# Patient Record
Sex: Female | Born: 1950 | Race: Black or African American | Hispanic: No
Health system: Southern US, Community
[De-identification: ages and names within clinical notes are randomized; demographics above are authoritative.]

## PROBLEM LIST (undated history)

## (undated) DIAGNOSIS — N289 Disorder of kidney and ureter, unspecified: Secondary | ICD-10-CM

## (undated) DIAGNOSIS — E7800 Pure hypercholesterolemia, unspecified: Secondary | ICD-10-CM

## (undated) DIAGNOSIS — E119 Type 2 diabetes mellitus without complications: Secondary | ICD-10-CM

## (undated) DIAGNOSIS — I10 Essential (primary) hypertension: Secondary | ICD-10-CM

## (undated) HISTORY — PX: LEG SURGERY: SHX1003

---

## 2018-01-18 ENCOUNTER — Emergency Department (HOSPITAL_BASED_OUTPATIENT_CLINIC_OR_DEPARTMENT_OTHER): Payer: Medicare Other

## 2018-01-18 ENCOUNTER — Encounter (HOSPITAL_BASED_OUTPATIENT_CLINIC_OR_DEPARTMENT_OTHER): Payer: Self-pay

## 2018-01-18 ENCOUNTER — Emergency Department (HOSPITAL_BASED_OUTPATIENT_CLINIC_OR_DEPARTMENT_OTHER)
Admission: EM | Admit: 2018-01-18 | Discharge: 2018-01-18 | Disposition: A | Discharge status: Home / Self Care | Payer: Medicare Other | Attending: Emergency Medicine | Admitting: Emergency Medicine

## 2018-01-18 ENCOUNTER — Other Ambulatory Visit: Payer: Self-pay

## 2018-01-18 DIAGNOSIS — W01198A Fall on same level from slipping, tripping and stumbling with subsequent striking against other object, initial encounter: Secondary | ICD-10-CM | POA: Diagnosis not present

## 2018-01-18 DIAGNOSIS — I10 Essential (primary) hypertension: Secondary | ICD-10-CM | POA: Diagnosis not present

## 2018-01-18 DIAGNOSIS — Y92008 Other place in unspecified non-institutional (private) residence as the place of occurrence of the external cause: Secondary | ICD-10-CM | POA: Insufficient documentation

## 2018-01-18 DIAGNOSIS — Y998 Other external cause status: Secondary | ICD-10-CM | POA: Diagnosis not present

## 2018-01-18 DIAGNOSIS — S8992XA Unspecified injury of left lower leg, initial encounter: Secondary | ICD-10-CM | POA: Diagnosis present

## 2018-01-18 DIAGNOSIS — S93601A Unspecified sprain of right foot, initial encounter: Secondary | ICD-10-CM | POA: Insufficient documentation

## 2018-01-18 DIAGNOSIS — E119 Type 2 diabetes mellitus without complications: Secondary | ICD-10-CM | POA: Insufficient documentation

## 2018-01-18 DIAGNOSIS — W19XXXA Unspecified fall, initial encounter: Secondary | ICD-10-CM

## 2018-01-18 DIAGNOSIS — S8392XA Sprain of unspecified site of left knee, initial encounter: Secondary | ICD-10-CM | POA: Insufficient documentation

## 2018-01-18 DIAGNOSIS — Y9389 Activity, other specified: Secondary | ICD-10-CM | POA: Diagnosis not present

## 2018-01-18 HISTORY — DX: Disorder of kidney and ureter, unspecified: N28.9

## 2018-01-18 HISTORY — DX: Pure hypercholesterolemia, unspecified: E78.00

## 2018-01-18 HISTORY — DX: Essential (primary) hypertension: I10

## 2018-01-18 HISTORY — DX: Type 2 diabetes mellitus without complications: E11.9

## 2018-01-18 MED ORDER — ACETAMINOPHEN 325 MG PO TABS
ORAL_TABLET | ORAL | Status: AC
  Filled 2018-01-18: qty 2

## 2018-01-18 MED ORDER — ACETAMINOPHEN 500 MG PO TABS: 1000.0000 mg | ORAL_TABLET | Freq: Four times a day (QID) | ORAL | 0 refills | Status: AC | PRN

## 2018-01-18 MED ORDER — ACETAMINOPHEN 325 MG PO TABS
650.0000 mg | ORAL_TABLET | Freq: Once | ORAL | Status: AC
Start: 2018-01-18 — End: 2018-01-18
  Administered 2018-01-18: 650 mg via ORAL

## 2018-01-18 MED ORDER — TRAMADOL HCL 50 MG PO TABS
100.0000 mg | ORAL_TABLET | Freq: Once | ORAL | Status: AC
  Administered 2018-01-18: 100 mg via ORAL
  Filled 2018-01-18: qty 2

## 2018-01-18 MED ORDER — TRAMADOL HCL 50 MG PO TABS: ORAL_TABLET | ORAL | 0 refills | Status: AC

## 2018-01-18 NOTE — ED Triage Notes (Signed)
Pt arrives via EMS after falling twice at home today; pt is dialysis pt; fistula in left arm. Pt reports right foot and left knee pain. States she tripped both times; denies LOC.

## 2018-01-18 NOTE — ED Provider Notes (Signed)
[facility] EMERGENCY DEPARTMENT Provider Note   CSN: [id number] Arrival date & time: 01/18/18  1945     History   Chief Complaint Chief Complaint  Patient presents with  . Fall    HPI Sandra Green is a 67 y.o. female.  HPI Patient fell twice today.  The first time she was stepping off of the porch.  Her daughter was with her.  She reports she just forgot that she needed to step down on the first step.  She landed on her knees.  She reports particularly on her left knee.  She reports her left knee has hurt since that time.  Reports that also her right foot hurts.  She reports is mostly the top of the foot and along the heel.  They are very painful with weightbearing.  She reports she did have a second fall today.  Her daughter was not with her.  She was in her bedroom.  Patient reports that she lost her balance.  She denies she sustained any kind of injury with this fall.  Reports that they were planning to come to the emergency department anyway for her knee and foot pain.  No head injury, no neck pain, no chest or abdominal injury. Past Medical History:  Diagnosis Date  . Diabetes mellitus without complication (HCC)   . Hypercholesteremia   . Hypertension   . Renal disorder     There are no active problems to display for this patient.   Past Surgical History:  Procedure Laterality Date  . LEG SURGERY       OB History   None      Home Medications    Prior to Admission medications   Medication Sig Start Date End Date Taking? Authorizing Provider  acetaminophen (TYLENOL) 500 MG tablet Take 2 tablets (1,000 mg total) by mouth every 6 (six) hours as needed. 01/18/18   Arby Barrette, MD  traMADol Janean Sark) 50 MG tablet 1-2 every 6 hours as needed 01/18/18   Arby Barrette, MD    Family History No family history on file.  Social History Social History   Tobacco Use  . Smoking status: Not on file  Substance Use Topics  . Alcohol use: Not on file  . Drug  use: Not on file     Allergies   Phenergan [promethazine hcl] and Vioxx [rofecoxib]   Review of Systems Review of Systems 10 Systems reviewed and are negative for acute change except as noted in the HPI.   Physical Exam Updated Vital Signs BP 117/83 (BP Location: Right Arm)   Pulse 72   Temp 99.6 F (37.6 C) (Oral)   Resp 20   Ht 5\' 3"  (1.6 m)   Wt 97.5 kg (215 lb)   SpO2 94%   BMI 38.09 kg/m   Physical Exam  Constitutional: She is oriented to person, place, and time.  Patient is alert and appropriate.  No respiratory distress.  Mental status clear.  HENT:  Head: Normocephalic and atraumatic.  Nose: Nose normal.  Eyes: EOM are normal.  Neck: Neck supple.  Cardiovascular: Normal rate, regular rhythm, normal heart sounds and intact distal pulses.  Pulmonary/Chest: Effort normal and breath sounds normal. She exhibits no tenderness.  Abdominal: She exhibits no distension. There is no tenderness. There is no guarding.  Musculoskeletal:  Normal visual inspection of bilateral lower extremities.  Patient does have very well-healed old scar on right knee from apparent knee replacement.  Left knee is knee that is painful.  Objectively, there is no swelling, no abrasion, no erythema.  No soft tissue anomalies appreciable.  She endorses discomfort to palpation over the patella and the joint lines.  Calf is soft and nontender.  No significant peripheral edema.  And also endorses significant tenderness to any palpation over the right foot.  objectively, no deformity, no soft tissue abrasions, swelling or erythema.  Neurological: She is alert and oriented to person, place, and time. No cranial nerve deficit. She exhibits normal muscle tone. Coordination normal.  Skin: Skin is warm and dry.  Psychiatric: She has a normal mood and affect.     ED Treatments / Results  Labs (all labs ordered are listed, but only abnormal results are displayed) Labs Reviewed - No data to  display  EKG None  Radiology Dg Knee Complete 4 Views Left  Result Date: 01/18/2018 CLINICAL DATA:  Patient fell at home today. Right foot and left knee pain. EXAM: LEFT KNEE - COMPLETE 4+ VIEW COMPARISON:  None. FINDINGS: There Is soft tissue swelling along the anteromedial aspect of the left knee. Tricompartmental osteoarthritic joint space narrowing is identified with spurring, more so involving the medial femorotibial compartment. 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 No acute fracture, joint effusion or malalignment. Diffuse atherosclerotic calcifications of the included femoral through tibial arteries. IMPRESSION: Tricompartmental osteoarthritis of the left knee without acute fracture nor joint effusion. Electronically Signed   By: Tollie Ethavid  Kwon M.D.   On: 01/18/2018 22:44   Dg Foot Complete Right  Result Date: 01/18/2018 CLINICAL DATA:  Patient fell at home today.  Right foot pain. EXAM: RIGHT FOOT COMPLETE - 3+ VIEW COMPARISON:  None. FINDINGS: Diffuse bone demineralization. Degenerative changes in the first metatarsal-phalangeal joint. No evidence of acute fracture or dislocation. No focal bone lesion or bone destruction. Soft tissues are unremarkable. Prominent vascular calcifications. IMPRESSION: No acute bony abnormalities. Degenerative changes of the first metatarsal-phalangeal joint. Vascular calcifications. Electronically Signed   By: Burman NievesWilliam  Stevens M.D.   On: 01/18/2018 22:39    Procedures Procedures (including critical care time)  Medications Ordered in ED Medications  acetaminophen (TYLENOL) tablet 650 mg (650 mg Oral Given 01/18/18 2135)  traMADol (ULTRAM) tablet 100 mg (100 mg Oral Given 01/18/18 2313)     Initial Impression / Assessment and Plan / ED Course  I have reviewed the triage vital signs and the nursing notes.  Pertinent labs & imaging results that were available during my care of the patient were reviewed by me and considered in my medical  decision making (see chart for details).      Final Clinical Impressions(s) / ED Diagnoses   Final diagnoses:  Fall, initial encounter  Sprain of left knee, unspecified ligament, initial encounter  Sprain of right foot, initial encounter  Patient presents with 2 mechanical falls today.  X-rays do not show any fracture.  Cervical exam shows normal appearance without soft tissue swelling or abrasions.  Patient has tenderness over the anterior knee and joint lines.  Probable knee sprain.  Also general tenderness over the forefoot on the right in the heel without objective soft tissue anomaly.  Suspect foot sprain.  Plan will be for rest elevation and icing.  Tramadol and acetaminophen for pain.  ED Discharge Orders        Ordered    acetaminophen (TYLENOL) 500 MG tablet  Every 6 hours PRN     01/18/18 2333    traMADol (ULTRAM) 50 MG tablet     01/18/18 2333  Arby Barrette, MD 01/19/18 [phone]

## 2018-01-18 NOTE — Discharge Instructions (Signed)
1.  Follow-up with your family doctor the beginning of the week. 2.  Follow-up with an orthopedic doctor.  Your family doctor may suggest or refer you.  You may also use the orthopedic doctor listed in your discharge instructions.

## 2018-01-18 NOTE — ED Notes (Signed)
ED Provider at bedside. 

## 2018-11-25 ENCOUNTER — Other Ambulatory Visit: Payer: Self-pay

## 2018-11-25 ENCOUNTER — Emergency Department (HOSPITAL_COMMUNITY): Payer: Medicare Other

## 2018-11-25 ENCOUNTER — Encounter (HOSPITAL_COMMUNITY): Payer: Self-pay

## 2018-11-25 ENCOUNTER — Emergency Department (HOSPITAL_COMMUNITY)
Admission: EM | Admit: 2018-11-25 | Discharge: 2018-11-26 | Disposition: A | Discharge status: Home / Self Care | Payer: Medicare Other | Attending: Emergency Medicine | Admitting: Emergency Medicine

## 2018-11-25 DIAGNOSIS — G8929 Other chronic pain: Secondary | ICD-10-CM

## 2018-11-25 DIAGNOSIS — R110 Nausea: Secondary | ICD-10-CM | POA: Diagnosis not present

## 2018-11-25 DIAGNOSIS — M545 Low back pain, unspecified: Secondary | ICD-10-CM

## 2018-11-25 DIAGNOSIS — I10 Essential (primary) hypertension: Secondary | ICD-10-CM | POA: Insufficient documentation

## 2018-11-25 DIAGNOSIS — Z79899 Other long term (current) drug therapy: Secondary | ICD-10-CM | POA: Insufficient documentation

## 2018-11-25 DIAGNOSIS — E7800 Pure hypercholesterolemia, unspecified: Secondary | ICD-10-CM | POA: Diagnosis not present

## 2018-11-25 DIAGNOSIS — E119 Type 2 diabetes mellitus without complications: Secondary | ICD-10-CM | POA: Insufficient documentation

## 2018-11-25 LAB — CBC WITH DIFFERENTIAL/PLATELET
Abs Immature Granulocytes: 0.04 10*3/uL (ref 0.00–0.07)
Basophils Absolute: 0 10*3/uL (ref 0.0–0.1)
Basophils Relative: 0 %
Eosinophils Absolute: 0.1 10*3/uL (ref 0.0–0.5)
Eosinophils Relative: 1 %
HCT: 37 % (ref 36.0–46.0)
Hemoglobin: 10.9 g/dL — ABNORMAL LOW (ref 12.0–15.0)
Immature Granulocytes: 0 %
Lymphocytes Relative: 6 %
Lymphs Abs: 0.6 10*3/uL — ABNORMAL LOW (ref 0.7–4.0)
MCH: 27.5 pg (ref 26.0–34.0)
MCHC: 29.5 g/dL — ABNORMAL LOW (ref 30.0–36.0)
MCV: 93.4 fL (ref 80.0–100.0)
Monocytes Absolute: 0 10*3/uL — ABNORMAL LOW (ref 0.1–1.0)
Monocytes Relative: 0 %
Neutro Abs: 8.5 10*3/uL — ABNORMAL HIGH (ref 1.7–7.7)
Neutrophils Relative %: 93 %
Platelets: 104 10*3/uL — ABNORMAL LOW (ref 150–400)
RBC: 3.96 MIL/uL (ref 3.87–5.11)
RDW: 15.3 % (ref 11.5–15.5)
WBC: 9.2 10*3/uL (ref 4.0–10.5)
nRBC: 0 % (ref 0.0–0.2)

## 2018-11-25 LAB — COMPREHENSIVE METABOLIC PANEL
ALT: 15 U/L (ref 0–44)
AST: 24 U/L (ref 15–41)
Albumin: 3.9 g/dL (ref 3.5–5.0)
Alkaline Phosphatase: 277 U/L — ABNORMAL HIGH (ref 38–126)
Anion gap: 18 — ABNORMAL HIGH (ref 5–15)
BUN: 34 mg/dL — ABNORMAL HIGH (ref 8–23)
CO2: 20 mmol/L — ABNORMAL LOW (ref 22–32)
Calcium: 8.6 mg/dL — ABNORMAL LOW (ref 8.9–10.3)
Chloride: 103 mmol/L (ref 98–111)
Creatinine, Ser: 12.53 mg/dL — ABNORMAL HIGH (ref 0.44–1.00)
GFR calc Af Amer: 3 mL/min — ABNORMAL LOW (ref >60)
GFR calc non Af Amer: 3 mL/min — ABNORMAL LOW (ref >60)
Glucose, Bld: 107 mg/dL — ABNORMAL HIGH (ref 70–99)
Potassium: 2.9 mmol/L — ABNORMAL LOW (ref 3.5–5.1)
Sodium: 141 mmol/L (ref 135–145)
Total Bilirubin: 1 mg/dL (ref 0.3–1.2)
Total Protein: 6.9 g/dL (ref 6.5–8.1)

## 2018-11-25 LAB — LIPASE, BLOOD: Lipase: 29 U/L (ref 11–51)

## 2018-11-25 MED ORDER — ONDANSETRON 4 MG PO TBDP
4.0000 mg | ORAL_TABLET | Freq: Once | ORAL | Status: AC | PRN
  Administered 2018-11-25: 4 mg via ORAL
  Filled 2018-11-25: qty 1

## 2018-11-25 MED ORDER — OXYCODONE-ACETAMINOPHEN 5-325 MG PO TABS
2.0000 | ORAL_TABLET | Freq: Once | ORAL | Status: AC
  Administered 2018-11-25: 2 via ORAL
  Filled 2018-11-25: qty 2

## 2018-11-25 MED ORDER — ONDANSETRON HCL 4 MG PO TABS: 4.0000 mg | ORAL_TABLET | Freq: Four times a day (QID) | ORAL | 0 refills | Status: AC

## 2018-11-25 MED ORDER — ONDANSETRON 4 MG PO TBDP
4.0000 mg | ORAL_TABLET | Freq: Once | ORAL | Status: AC
  Administered 2018-11-25: 4 mg via ORAL
  Filled 2018-11-25: qty 1

## 2018-11-25 NOTE — ED Notes (Signed)
Spoke with RN Selena Batten at [facility]. Patient is a home dialysis patient that preforms home dialysis Mon, Tues, Thur, Fri. She has been having trouble keeping up with this at home and is scheduled to come back and have in center dialysis. RN requests to be called if you have any questions about dialysis treatment. Most recent notes faxed from facility.   [phone] Selena Batten, RN

## 2018-11-25 NOTE — ED Notes (Signed)
Pt now vomiting

## 2018-11-25 NOTE — ED Triage Notes (Signed)
Pt from home with ems for c,o severe sudden onset of back pain during her home dialysis treatment. Pt was not able to finish session, last full treatment was on Friday. Pt a.o

## 2018-11-29 NOTE — ED Provider Notes (Signed)
[facility] Provider Note   CSN: [id number] Arrival date & time: 11/25/18  1612     History   Chief Complaint Chief Complaint  Patient presents with  . Back Pain    HPI Sandra AuerbachMartha Green is a 68 y.o. female.  HPI   68yF with back pain. Chronic but worse recently. Denies acute trauma/strain. No acute numbness, tingling, loss of strength.  Pt has been only partially compliant with home HD. Daughter called nursing line about this and was advised to bring pt to the hospital to assess the need for emergent HD.   Past Medical History:  Diagnosis Date  . Diabetes mellitus without complication (HCC)   . Hypercholesteremia   . Hypertension   . Renal disorder     There are no active problems to display for this patient.   Past Surgical History:  Procedure Laterality Date  . LEG SURGERY       OB History   No obstetric history on file.      Home Medications    Prior to Admission medications   Medication Sig Start Date End Date Taking? Authorizing Provider  acetaminophen (TYLENOL) 500 MG tablet Take 2 tablets (1,000 mg total) by mouth every 6 (six) hours as needed. 01/18/18  Yes Pfeiffer, Lebron ConnersMarcy, MD  amLODipine-benazepril (LOTREL) 5-10 MG capsule Take 1 capsule by mouth daily. 08/11/18  Yes [provider]  aspirin 81 MG chewable tablet Chew 81 mg by mouth daily.   Yes [provider]  atorvastatin (LIPITOR) 40 MG tablet Take 40 mg by mouth daily. 08/08/18  Yes [provider]  B Complex-C-Zn-Folic Acid (DIALYVITE 800 WITH ZINC) 0.8 MG TABS Take 800 mg by mouth daily. 11/11/18  Yes [provider]  calcium acetate (PHOSLO) 667 MG capsule Take 667 mg by mouth 3 (three) times daily with meals. 10/28/18  Yes [provider]  cinacalcet (SENSIPAR) 60 MG tablet Take 60 mg by mouth daily. 11/21/18  Yes [provider]  FLUoxetine (PROZAC) 20 MG capsule Take 20 mg by mouth daily.   Yes [provider]  gabapentin (NEURONTIN) 100 MG capsule Take 100 mg by mouth daily.   Yes [provider]  HYDROcodone-acetaminophen (NORCO) 7.5-325 MG tablet Take 1 tablet by mouth 4 (four) times daily. 10/23/18  Yes [provider]  hydrOXYzine (ATARAX/VISTARIL) 10 MG tablet Take 10 mg by mouth daily.   Yes [provider]  midodrine (PROAMATINE) 5 MG tablet Take 5 mg by mouth daily. 06/11/18  Yes [provider]  omeprazole (PRILOSEC) 20 MG capsule Take 20 mg by mouth daily. 11/01/18  Yes [provider]  RENVELA 800 MG tablet Take 800-1,600 mg by mouth See admin instructions. Take 2 tablets by mouth 3 times daily with meals and 1 tablet twice a day with snacks as needed 07/15/18  Yes [provider]  traZODone (DESYREL) 50 MG tablet Take 25-50 mg by mouth at bedtime. 11/19/18  Yes [provider]  ondansetron (ZOFRAN) 4 MG tablet Take 1 tablet (4 mg total) by mouth every 6 (six) hours. 11/25/18   Raeford RazorKohut, Brytnee Bechler, MD  traMADol Janean Sark(ULTRAM) 50 MG tablet 1-2 every 6 hours as needed Patient not taking: Reported on 11/25/2018 01/18/18   Arby BarrettePfeiffer, Marcy, MD    Family History No family history on file.  Social History Social History   Tobacco Use  . Smoking status: Not on file  Substance Use Topics  . Alcohol use: Not on file  .  Drug use: Not on file     Allergies   Phenergan [promethazine hcl] and Vioxx [rofecoxib]   Review of Systems Review of Systems  All systems reviewed and negative, other than as noted in HPI.  Physical Exam Updated Vital Signs BP (!) 114/57 (BP Location: Right Arm)   Pulse 67   Temp 99 F (37.2 C) (Oral)   Resp 17   SpO2 97%   Physical Exam Vitals signs and nursing note reviewed.  Constitutional:      General: She is not in acute distress.    Appearance: She is well-developed.  HENT:     Head: Normocephalic and atraumatic.  Eyes:     General:        Right eye: No discharge.        Left eye: No  discharge.     Conjunctiva/sclera: Conjunctivae normal.  Neck:     Musculoskeletal: Neck supple.  Cardiovascular:     Rate and Rhythm: Normal rate and regular rhythm.     Heart sounds: Normal heart sounds. No murmur. No friction rub. No gallop.   Pulmonary:     Effort: Pulmonary effort is normal. No respiratory distress.     Breath sounds: Normal breath sounds.  Abdominal:     General: There is no distension.     Palpations: Abdomen is soft.     Tenderness: There is no abdominal tenderness.  Musculoskeletal:        General: No tenderness.     Comments: Mild ttp mid to lower lumbar back. No concerning skin changes.   Skin:    General: Skin is warm and dry.  Neurological:     Mental Status: She is alert.  Psychiatric:        Behavior: Behavior normal.        Thought Content: Thought content normal.      ED Treatments / Results  Labs (all labs ordered are listed, but only abnormal results are displayed) Labs Reviewed  COMPREHENSIVE METABOLIC PANEL - Abnormal; Notable for the following components:      Result Value   Potassium 2.9 (*)    CO2 20 (*)    Glucose, Bld 107 (*)    BUN 34 (*)    Creatinine, Ser 12.53 (*)    Calcium 8.6 (*)    Alkaline Phosphatase 277 (*)    GFR calc non Af Amer 3 (*)    GFR calc Af Amer 3 (*)    Anion gap 18 (*)    All other components within normal limits  CBC WITH DIFFERENTIAL/PLATELET - Abnormal; Notable for the following components:   Hemoglobin 10.9 (*)    MCHC 29.5 (*)    Platelets 104 (*)    Neutro Abs 8.5 (*)    Lymphs Abs 0.6 (*)    Monocytes Absolute 0.0 (*)    All other components within normal limits  LIPASE, BLOOD    EKG None  Radiology No results found.  Procedures Procedures (including critical care time)  Medications Ordered in ED Medications  ondansetron (ZOFRAN-ODT) disintegrating tablet 4 mg (4 mg Oral Given 11/25/18 1630)  oxyCODONE-acetaminophen (PERCOCET/ROXICET) 5-325 MG per tablet 2 tablet (2 tablets  Oral Given 11/25/18 2040)  ondansetron (ZOFRAN-ODT) disintegrating tablet 4 mg (4 mg Oral Given 11/25/18 2040)     Initial Impression / Assessment and Plan / ED Course  I have reviewed the triage vital signs and the nursing notes.  Pertinent labs & imaging results that were available during my care  of the patient were reviewed by me and considered in my medical decision making (see chart for details).     Back pain chronic. No red flags. Intermittent compliance with home HD. No emergent indication for HD currently. Pt is to schedule for in center HD if consistent home HD remains an issue.   It has been determined that no acute conditions requiring further emergency intervention are present at this time. The patient has been advised of the diagnosis and plan. I reviewed any labs and imaging including any potential incidental findings. I have reviewed nursing notes and appropriate previous records. We have discussed signs and symptoms that warrant return to the ED and they are listed in the discharge instructions.      Final Clinical Impressions(s) / ED Diagnoses   Final diagnoses:  Chronic midline low back pain without sciatica  Nausea    ED Discharge Orders         Ordered    ondansetron (ZOFRAN) 4 MG tablet  Every 6 hours     11/25/18 2324           Raeford Razor, MD 11/29/18 Flossie Buffy

## 2020-11-28 IMAGING — US US ABDOMEN LIMITED
1 series · 14 of 21 positions shown · non-contrast
Comparison: None.

CLINICAL DATA: Right upper quadrant pain with nausea.

EXAM:
ULTRASOUND ABDOMEN LIMITED RIGHT UPPER QUADRANT

[Series 1: us abdomen limited · 0.25mm/px · 14 of 21 slices shown]
[im 1/21]
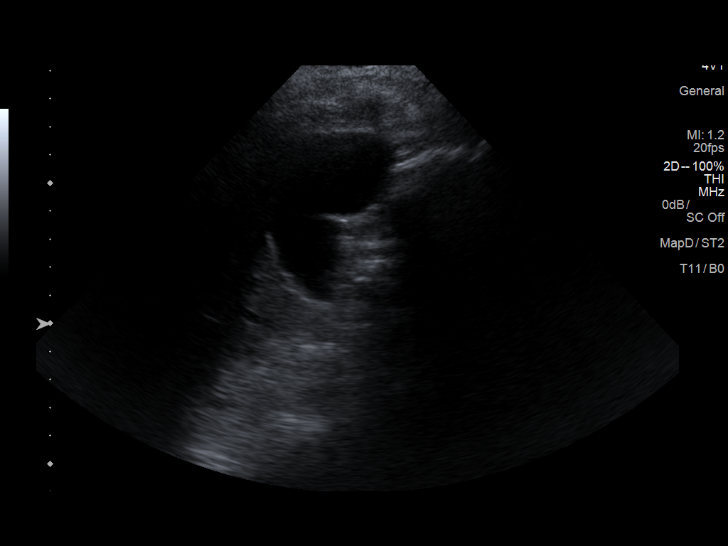
[im 3/21]
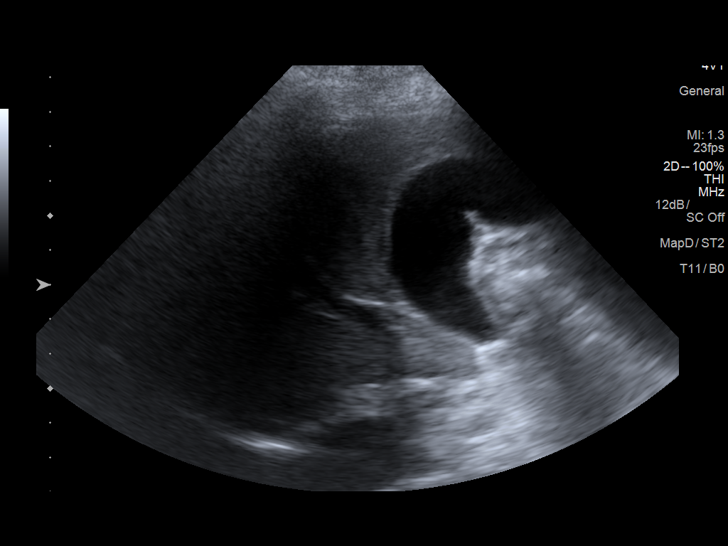
[im 4/21]
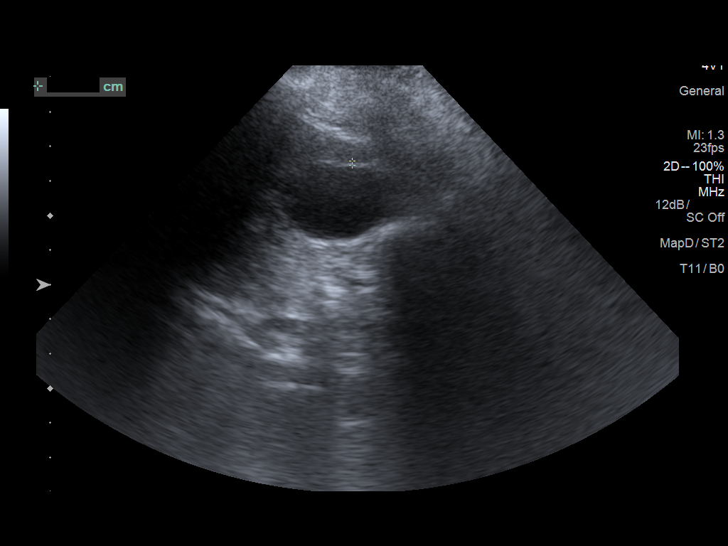
[im 6/21]
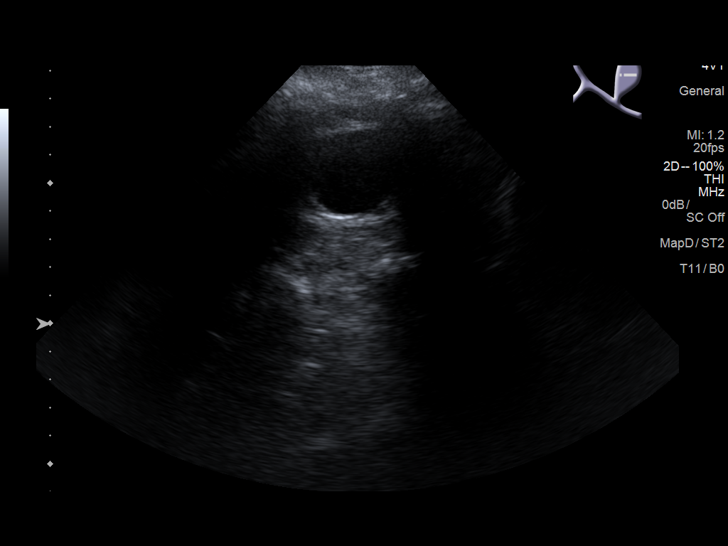
[im 7/21]
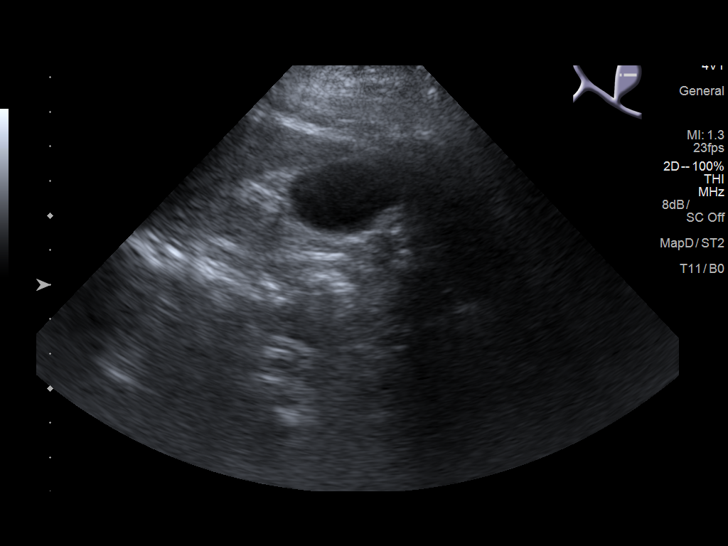
[im 9/21]
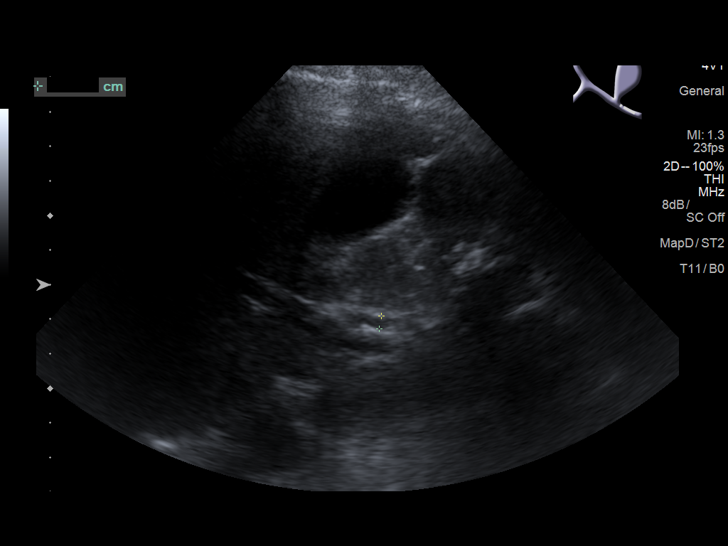
[im 10/21]
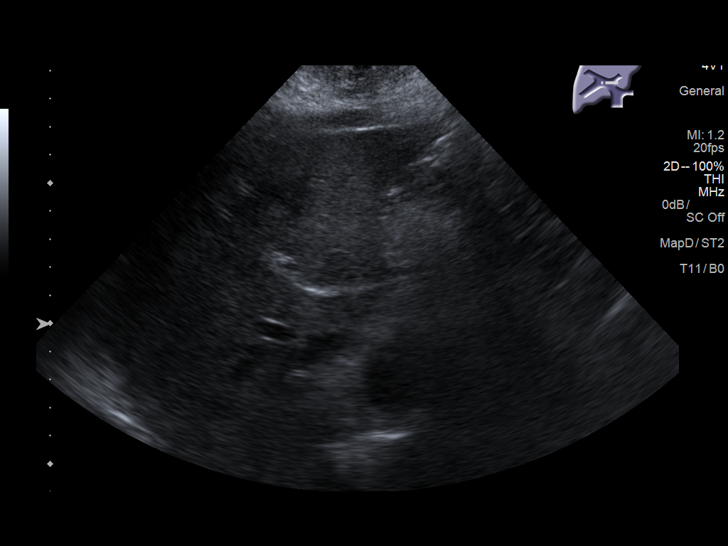
[im 12/21]
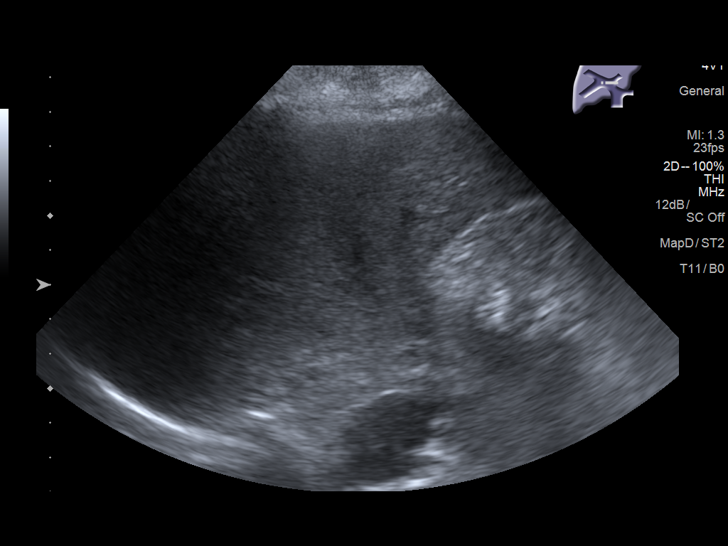
[im 13/21]
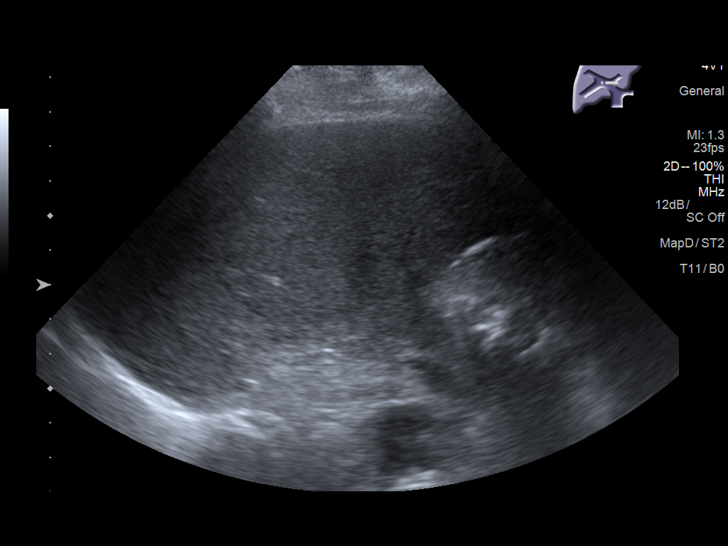
[im 15/21]
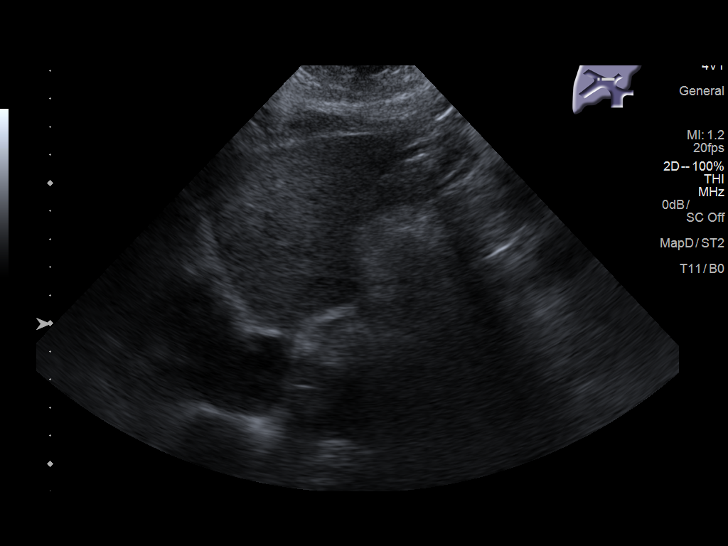
[im 16/21]
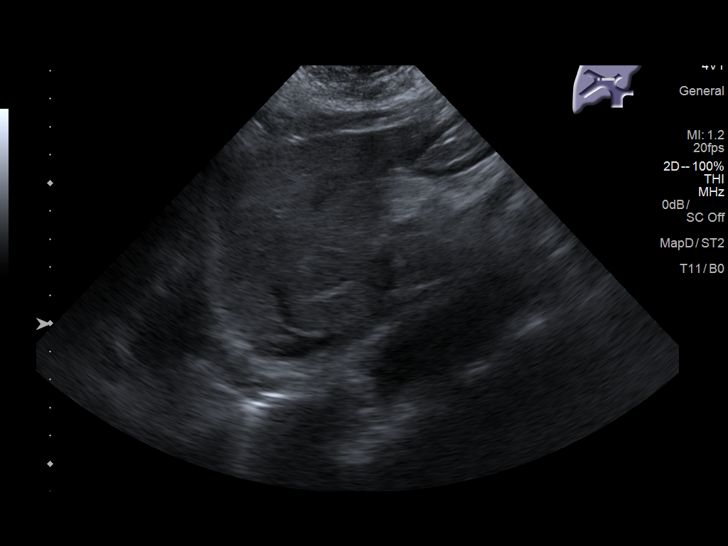
[im 18/21]
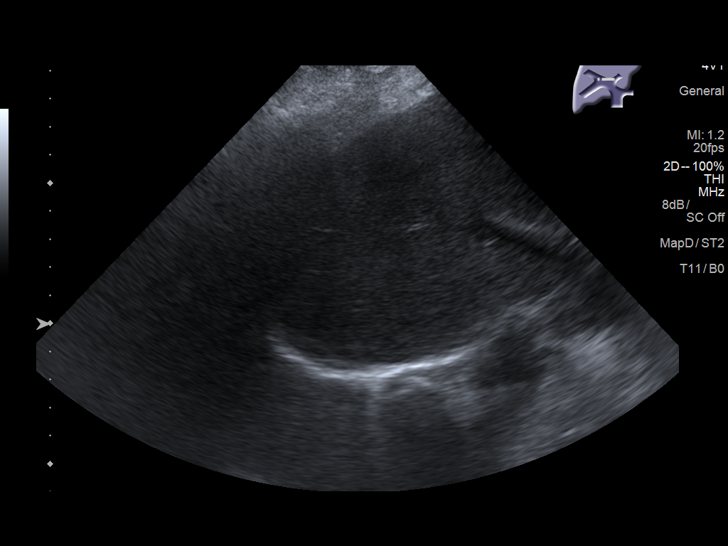
[im 19/21]
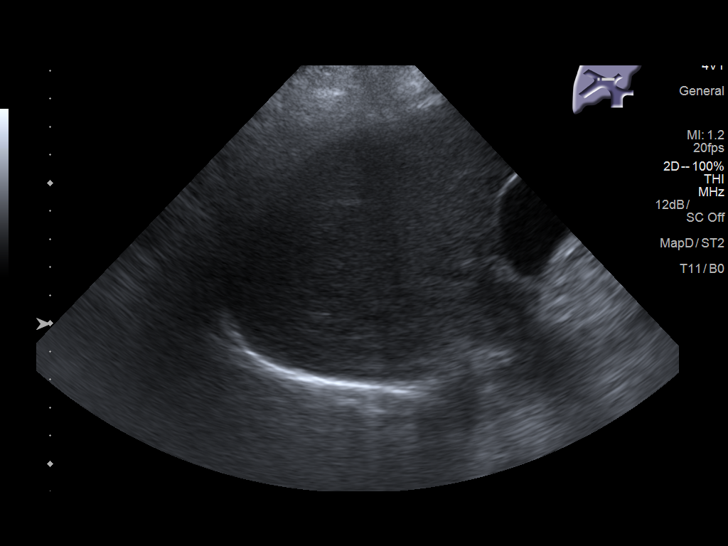
[im 21/21]
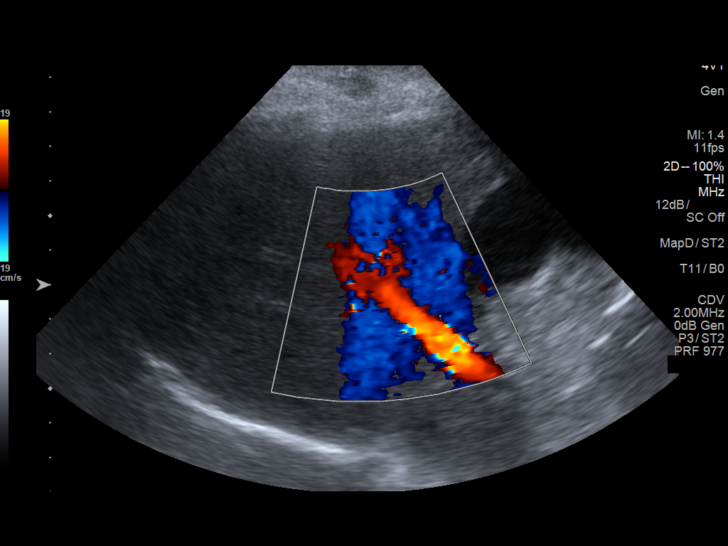

[14 of 21 positions shown; findings below may reference images not displayed]

FINDINGS: Gallbladder:

No gallstones or wall thickening visualized. No sonographic Murphy
sign noted by sonographer.

Common bile duct:

Diameter: 3.8 mm

Liver:

No focal lesion identified. Slightly coarsened echotexture of the
liver with mild increase in echogenicity.. Portal vein is patent on
color Doppler imaging with normal direction of blood flow towards
the liver.
IMPRESSION: Mild steatosis of the liver is suggested.  Unremarkable gallbladder.
